# Patient Record
Sex: Female | Born: 1995 | Race: White | Hispanic: No | Marital: Single | Smoking: Never smoker
Health system: Southern US, Community
[De-identification: ages and names within clinical notes are randomized; demographics above are authoritative.]

---

## 2019-05-05 ENCOUNTER — Emergency Department (HOSPITAL_COMMUNITY): Payer: PRIVATE HEALTH INSURANCE

## 2019-05-05 ENCOUNTER — Emergency Department (HOSPITAL_COMMUNITY)
Admission: EM | Admit: 2019-05-05 | Discharge: 2019-05-05 | Disposition: A | Discharge status: Home / Self Care | Payer: PRIVATE HEALTH INSURANCE | Attending: Emergency Medicine | Admitting: Emergency Medicine

## 2019-05-05 ENCOUNTER — Encounter (HOSPITAL_COMMUNITY): Payer: Self-pay | Admitting: Emergency Medicine

## 2019-05-05 ENCOUNTER — Other Ambulatory Visit: Payer: Self-pay

## 2019-05-05 DIAGNOSIS — R1032 Left lower quadrant pain: Secondary | ICD-10-CM | POA: Diagnosis present

## 2019-05-05 DIAGNOSIS — N200 Calculus of kidney: Secondary | ICD-10-CM | POA: Diagnosis not present

## 2019-05-05 LAB — URINALYSIS, ROUTINE W REFLEX MICROSCOPIC
Bacteria, UA: NONE SEEN
Bilirubin Urine: NEGATIVE
Glucose, UA: NEGATIVE mg/dL
Ketones, ur: NEGATIVE mg/dL
Leukocytes,Ua: NEGATIVE
Nitrite: NEGATIVE
Protein, ur: NEGATIVE mg/dL
RBC / HPF: >50 RBC/hpf — ABNORMAL HIGH (ref 0–5)
Specific Gravity, Urine: >1.046 — ABNORMAL HIGH (ref 1.005–1.030)
pH: 6 (ref 5.0–8.0)

## 2019-05-05 LAB — WET PREP, GENITAL
Clue Cells Wet Prep HPF POC: NONE SEEN
Sperm: NONE SEEN
Trich, Wet Prep: NONE SEEN
WBC, Wet Prep HPF POC: NONE SEEN
Yeast Wet Prep HPF POC: NONE SEEN

## 2019-05-05 LAB — COMPREHENSIVE METABOLIC PANEL
ALT: 16 U/L (ref 0–44)
AST: 19 U/L (ref 15–41)
Albumin: 4.1 g/dL (ref 3.5–5.0)
Alkaline Phosphatase: 73 U/L (ref 38–126)
Anion gap: 11 (ref 5–15)
BUN: 11 mg/dL (ref 6–20)
CO2: 23 mmol/L (ref 22–32)
Calcium: 9.2 mg/dL (ref 8.9–10.3)
Chloride: 108 mmol/L (ref 98–111)
Creatinine, Ser: 0.78 mg/dL (ref 0.44–1.00)
GFR calc Af Amer: >60 mL/min (ref >60)
GFR calc non Af Amer: >60 mL/min (ref >60)
Glucose, Bld: 132 mg/dL — ABNORMAL HIGH (ref 70–99)
Potassium: 3.8 mmol/L (ref 3.5–5.1)
Sodium: 142 mmol/L (ref 135–145)
Total Bilirubin: 1.3 mg/dL — ABNORMAL HIGH (ref 0.3–1.2)
Total Protein: 7.1 g/dL (ref 6.5–8.1)

## 2019-05-05 LAB — CBC
HCT: 40.4 % (ref 36.0–46.0)
Hemoglobin: 13.9 g/dL (ref 12.0–15.0)
MCH: 41 pg — ABNORMAL HIGH (ref 26.0–34.0)
MCHC: 34.4 g/dL (ref 30.0–36.0)
MCV: 119.2 fL — ABNORMAL HIGH (ref 80.0–100.0)
Platelets: 340 10*3/uL (ref 150–400)
RBC: 3.39 MIL/uL — ABNORMAL LOW (ref 3.87–5.11)
RDW: 16.5 % — ABNORMAL HIGH (ref 11.5–15.5)
WBC: 13.3 10*3/uL — ABNORMAL HIGH (ref 4.0–10.5)
nRBC: 0 % (ref 0.0–0.2)

## 2019-05-05 LAB — I-STAT BETA HCG BLOOD, ED (MC, WL, AP ONLY): I-stat hCG, quantitative: <5 m[IU]/mL (ref <5)

## 2019-05-05 LAB — LIPASE, BLOOD: Lipase: 23 U/L (ref 11–51)

## 2019-05-05 LAB — HIV ANTIBODY (ROUTINE TESTING W REFLEX): HIV Screen 4th Generation wRfx: NONREACTIVE

## 2019-05-05 MED ORDER — IOHEXOL 300 MG/ML  SOLN
100.0000 mL | Freq: Once | INTRAMUSCULAR | Status: AC | PRN
  Administered 2019-05-05: 100 mL via INTRAVENOUS

## 2019-05-05 MED ORDER — ONDANSETRON HCL 4 MG/2ML IJ SOLN
4.0000 mg | Freq: Once | INTRAMUSCULAR | Status: AC
  Administered 2019-05-05: 4 mg via INTRAVENOUS
  Filled 2019-05-05: qty 2

## 2019-05-05 MED ORDER — SODIUM CHLORIDE 0.9% FLUSH: 3.0000 mL | Freq: Once | INTRAVENOUS | Status: DC

## 2019-05-05 MED ORDER — TAMSULOSIN HCL 0.4 MG PO CAPS: 0.4000 mg | ORAL_CAPSULE | Freq: Every day | ORAL | 0 refills | Status: AC

## 2019-05-05 MED ORDER — ONDANSETRON HCL 4 MG PO TABS: 4.0000 mg | ORAL_TABLET | Freq: Three times a day (TID) | ORAL | 0 refills | Status: AC | PRN

## 2019-05-05 MED ORDER — IBUPROFEN 600 MG PO TABS: 600.0000 mg | ORAL_TABLET | Freq: Four times a day (QID) | ORAL | 0 refills | Status: AC | PRN

## 2019-05-05 MED ORDER — SODIUM CHLORIDE (PF) 0.9 % IJ SOLN
INTRAMUSCULAR | Status: AC
  Administered 2019-05-05: 3 mL
  Filled 2019-05-05: qty 50

## 2019-05-05 MED ORDER — KETOROLAC TROMETHAMINE 30 MG/ML IJ SOLN
30.0000 mg | Freq: Once | INTRAMUSCULAR | Status: AC
  Administered 2019-05-05: 30 mg via INTRAVENOUS
  Filled 2019-05-05: qty 1

## 2019-05-05 MED ORDER — CEPHALEXIN 500 MG PO CAPS: ORAL_CAPSULE | ORAL | 0 refills | Status: AC

## 2019-05-05 MED ORDER — HYDROMORPHONE HCL 1 MG/ML IJ SOLN
1.0000 mg | Freq: Once | INTRAMUSCULAR | Status: AC
  Administered 2019-05-05: 1 mg via INTRAVENOUS
  Filled 2019-05-05: qty 1

## 2019-05-05 MED ORDER — MORPHINE SULFATE (PF) 4 MG/ML IV SOLN
4.0000 mg | Freq: Once | INTRAVENOUS | Status: AC
  Administered 2019-05-05: 4 mg via INTRAVENOUS
  Filled 2019-05-05: qty 1

## 2019-05-05 NOTE — ED Notes (Addendum)
Discharge paperwork and prescriptions reviewed with pt.  Pt verbalized understanding,  Ambulatory at discharge.

## 2019-05-05 NOTE — ED Provider Notes (Signed)
[facility] Provider Note   CSN: [id number] Arrival date & time: 05/05/19  2766   24 year old female presenting for evaluation of abdominal pain.  Patient reports she does have history of recurrent abdominal pain however this morning while in class she developed acute onset of pain to her left lower abdomen radiates to her back.  Pain is intense, sharp, with associated nauseous, and has vomited multiple episodes of nonbloody nonbilious contents.  Rates pain as 8 out of 10, nothing seems to make it better or worse.  No associated fever but she felt hot.  No chest pain shortness of breath.  She did not notice any blood in the urine.  Her last menstrual period was several weeks ago.  Her last sexual activities was 3 days ago.  She denies any specific treatment tried.  History Chief Complaint  Patient presents with  . Abdominal Pain  . Nausea  . Emesis  . Flank Pain    Maria[facility] Robinson is a 24 y.o. female.  The history is provided by the patient. No language interpreter was used.  Abdominal Pain Associated symptoms: vomiting   Emesis Associated symptoms: abdominal pain   Flank Pain Associated symptoms include abdominal pain.       History reviewed. No pertinent past medical history.  There are no problems to display for this patient.   History reviewed. No pertinent surgical history.   OB History   No obstetric history on file.     No family history on file.  Social History   Tobacco Use  . Smoking status: Never Smoker  . Smokeless tobacco: Never Used  Substance Use Topics  . Alcohol use: Never  . Drug use: Never    Home Medications Prior to Admission medications   Not on File    Allergies    Patient has no allergy information on record.  Review of Systems   Review of Systems  Gastrointestinal: Positive for abdominal pain and vomiting.  Genitourinary: Positive for flank pain.  All other systems reviewed and are  negative.   Physical Exam Updated Vital Signs BP 135/79 (BP Location: Right Arm)   Pulse 89   Temp 98.9 F (37.2 C) (Oral)   Resp 19   SpO2 100%   Physical Exam Vitals and nursing note reviewed.  Constitutional:      Appearance: She is well-developed.     Comments: Patient is bending over, appears uncomfortable, dry heaving and complain of abdominal pain.  HENT:     Head: Atraumatic.  Eyes:     Conjunctiva/sclera: Conjunctivae normal.  Cardiovascular:     Rate and Rhythm: Normal rate and regular rhythm.  Pulmonary:     Effort: Pulmonary effort is normal.     Breath sounds: Normal breath sounds.  Chest:     Chest wall: No tenderness.  Abdominal:     General: Abdomen is flat.     Tenderness: There is abdominal tenderness in the suprapubic area and left lower quadrant. There is guarding. There is no right CVA tenderness, left CVA tenderness or rebound. Negative signs include Murphy's sign, Rovsing's sign and McBurney's sign.  Genitourinary:    Comments: Chaperone present during exam.  No inguinal lymphadenopathy or inguinal hernia noted.  Normal external genitalia.  Cervical os is closed and normal appearance.  Minimal vaginal discharge noted.  On bimanual examination left adnexal tenderness without cervical motion tenderness. Musculoskeletal:     Cervical back: Neck supple.  Skin:    Findings: No rash.  Neurological:     Mental Status: She is alert.     ED Results / Procedures / Treatments   Labs (all labs ordered are listed, but only abnormal results are displayed) Labs Reviewed  COMPREHENSIVE METABOLIC PANEL - Abnormal; Notable for the following components:      Result Value   Glucose, Bld 132 (*)    Total Bilirubin 1.3 (*)    All other components within normal limits  CBC - Abnormal; Notable for the following components:   WBC 13.3 (*)    RBC 3.39 (*)    MCV 119.2 (*)    MCH 41.0 (*)    RDW 16.5 (*)    All other components within normal limits  URINALYSIS,  ROUTINE W REFLEX MICROSCOPIC - Abnormal; Notable for the following components:   Specific Gravity, Urine >1.046 (*)    Hgb urine dipstick LARGE (*)    RBC / HPF >50 (*)    All other components within normal limits  WET PREP, GENITAL  LIPASE, BLOOD  HIV ANTIBODY (ROUTINE TESTING W REFLEX)  RPR  I-STAT BETA HCG BLOOD, ED ([facility], [facility], [facility] ONLY)  GC/CHLAMYDIA PROBE AMP ([place]) NOT AT [facility]    EKG None  Radiology CT ABDOMEN PELVIS W CONTRAST  Result Date: 05/05/2019 CLINICAL DATA:  Abdominal pain EXAM: CT ABDOMEN AND PELVIS WITH CONTRAST TECHNIQUE: Multidetector CT imaging of the abdomen and pelvis was performed using the standard protocol following bolus administration of intravenous contrast. CONTRAST:  143mL OMNIPAQUE IOHEXOL 300 MG/ML  SOLN COMPARISON:  None. FINDINGS: Lower chest: There is slight bibasilar atelectasis. Lung bases otherwise are clear. Hepatobiliary: There is a degree of hepatic steatosis. There is a focal area of enhancement in the anterior segment of the left lobe of the liver peripherally measuring 0.9 x 0.6 cm, a questionable small hemangioma. No other focal liver lesions are evident. Gallbladder wall is not appreciably thickened. There is no evident biliary duct dilatation. Pancreas: There is no pancreatic mass or inflammatory focus. Spleen: No splenic lesions are evident. Adrenals/Urinary Tract: Adrenals bilaterally appear normal. There is no renal mass on either side. There is moderate hydronephrosis on the left. There is no intrarenal calculus on either side. There is a 2 mm calculus at the left ureterovesical junction. No other ureteral calculi evident. Urinary bladder is midline with wall thickness within normal limits. Stomach/Bowel: There is no appreciable bowel wall or mesenteric thickening. There is no evident bowel obstruction. Terminal ileum appears unremarkable. There is no evident free air or portal venous air. Vascular/Lymphatic: No abdominal aortic aneurysm. No  arterial vascular lesions are apparent. Major venous structures appear patent. There is no evident adenopathy in the abdomen or pelvis. Reproductive: The uterus is retroverted. There is a cyst in the left ovary measuring 2.8 x 2.3 cm, a likely dominant follicle. No other extrauterine pelvic/adnexal mass. Other: Appendix appears unremarkable. No abscess or ascites evident in the abdomen or pelvis. Musculoskeletal: There are no blastic or lytic bone lesions. No intramuscular or abdominal wall lesions evident. IMPRESSION: 1. 2 mm calculus at the left ureterovesical junction with moderate hydronephrosis on the left. 2. Hepatic steatosis. Questionable small hemangioma in the right lobe of the liver anteriorly. 3. No bowel obstruction. No abscess in the abdomen or pelvis. Appendix appears normal. 4.  Probable dominant follicle left ovary. Electronically Signed   By: Lowella Grip III M.D.   On: 05/05/2019 12:38    Procedures Procedures (including critical care time)  Medications Ordered in ED Medications  sodium chloride flush (NS) 0.9 % injection 3 mL (has no administration in time range)    ED Course  I have reviewed the triage vital signs and the nursing notes.  Pertinent labs & imaging results that were available during my care of the patient were reviewed by me and considered in my medical decision making (see chart for details).    MDM Rules/Calculators/A&P                      BP (!) 130/91   Pulse 72   Temp 98.9 F (37.2 C) (Oral)   Resp 19   SpO2 100%   Final Clinical Impression(s) / ED Diagnoses Final diagnoses:  Kidney stone on left side    Rx / DC Orders ED Discharge Orders         Ordered    tamsulosin (FLOMAX) 0.4 MG CAPS capsule  Daily     05/05/19 1440    ibuprofen (ADVIL) 600 MG tablet  Every 6 hours PRN     05/05/19 1440    ondansetron (ZOFRAN) 4 MG tablet  Every 8 hours PRN     05/05/19 1440    cephALEXin (KEFLEX) 500 MG capsule     05/05/19 1441          10:48 AM Patient here with no abdominal pain, appears to be very uncomfortable.  She does have tenderness to her suprapubic and left lower quadrant on exam but no CVA tenderness.  Will provide symptomatic treatment, will perform pelvic examination and will determine further management.  12:04 PM Patient does have left adnexal tenderness without cervical motion tenderness.  Pelvic examination is otherwise unremarkable.  Given her back pain and left lower quadrant abdominal pain, will obtain abdominal pelvic CT scan for further evaluation.  If negative, may consider pelvic ultrasound to rule out ovarian torsion however my suspicion is low.  Additional pain medication provided.  2:35 PM UA shows large hemoglobin and urine dipsticks as well as 11-20 WBC.  Leukocyte Estrace negative and nitrite is negative.  Wet prep is unremarkable.  Now leukocytosis with WBC 13.3.  Electrolyte panels are reassuring, normal kidney function.  An abdominal and pelvis CT scan obtained showing a 2 mm kidney stone at the left UVJ with moderate hydronephrosis.  After receiving several dose of pain medication at this time patient is resting more comfortable.  I discussed finding with patient and felt that kidney stone is likely the cause of her pain.  Will discharge home with symptomatic treatment, urology referral, and as a precaution we will also prescribe antibiotic and patient recommend to take it if she developed urinary symptoms.  Return precaution discussed.   Fayrene Helper, PA-C 05/05/19 1444    Linwood Dibbles, MD 05/06/19 [phone]

## 2019-05-05 NOTE — ED Notes (Signed)
Pt attempting to void.  

## 2019-05-05 NOTE — Discharge Instructions (Signed)
You have been diagnosed with a 2 mm kidney stone on the left side.  This is the source of your pain.  You would likely pass the stone in the next 1 to 2 days.  There is early signs of a potential urinary tract infection on your urine today.  If you develop burning sensation while urinating or urinary frequency or urgency, take antibiotic as prescribed for the full duration.

## 2019-05-05 NOTE — ED Notes (Signed)
Pt ambulatory to bathroom, reports being unable to void at this time.  Pt aware urine sample is needed.

## 2019-05-05 NOTE — ED Triage Notes (Signed)
Patient here from home reports left lower quadrant abd pain radiating around to back, n/v that started today.

## 2019-05-06 LAB — GC/CHLAMYDIA PROBE AMP (~~LOC~~) NOT AT ARMC
Chlamydia: NEGATIVE
Neisseria Gonorrhea: NEGATIVE

## 2019-05-06 LAB — RPR: RPR Ser Ql: NONREACTIVE

## 2020-10-19 IMAGING — CT CT ABD-PELV W/ CM
2 of 4 series · 14 of 46 positions shown, 16 images · IV contrast (omnipaque)
Comparison: None.

CLINICAL DATA: Abdominal pain

EXAM:
CT ABDOMEN AND PELVIS WITH CONTRAST
TECHNIQUE: Multidetector CT imaging of the abdomen and pelvis was performed
using the standard protocol following bolus administration of
intravenous contrast.
CONTRAST:  100mL OMNIPAQUE IOHEXOL 300 MG/ML  SOLN

[Series 2: axial st · axial · 0.60mm/px · z∈[-413,-28]mm · 11 of 87 slices shown, 13 images]
[im 5/87  soft-tissue]
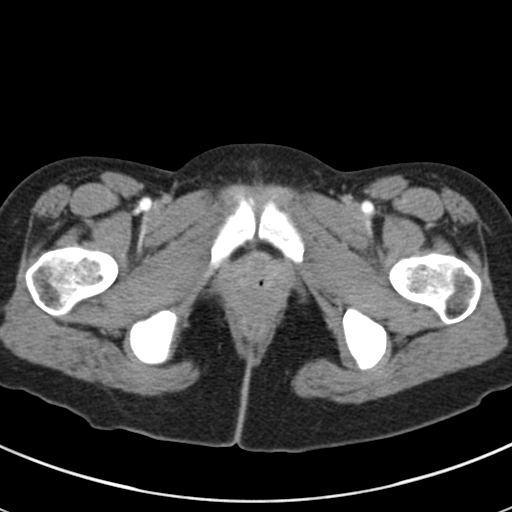
[im 5/87  bone]
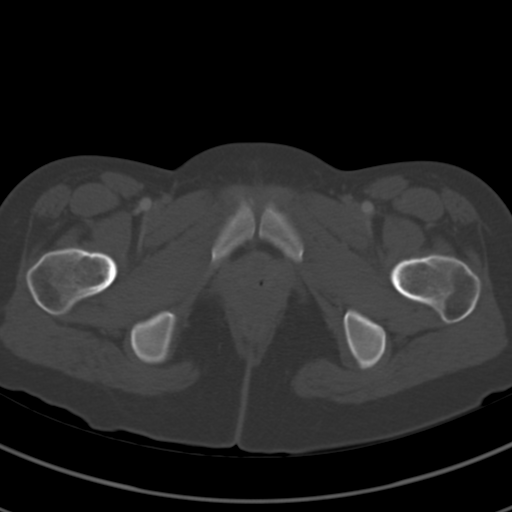
[im 13/87  soft-tissue]
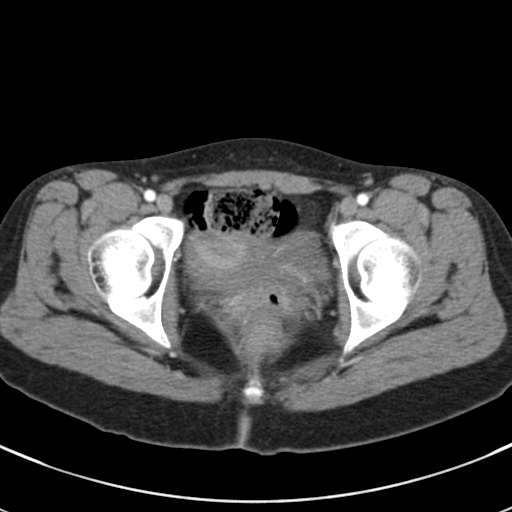
[im 22/87  soft-tissue]
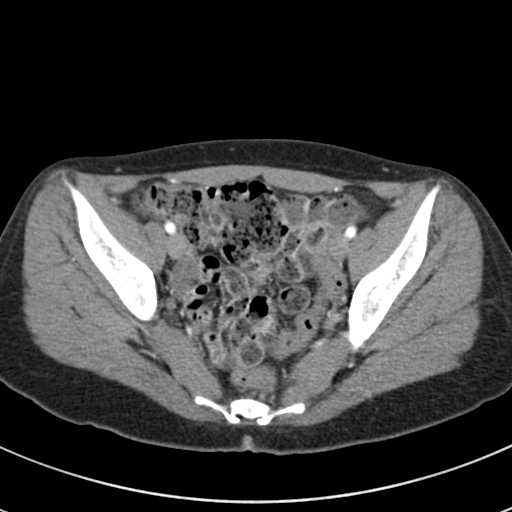
[im 31/87  soft-tissue]
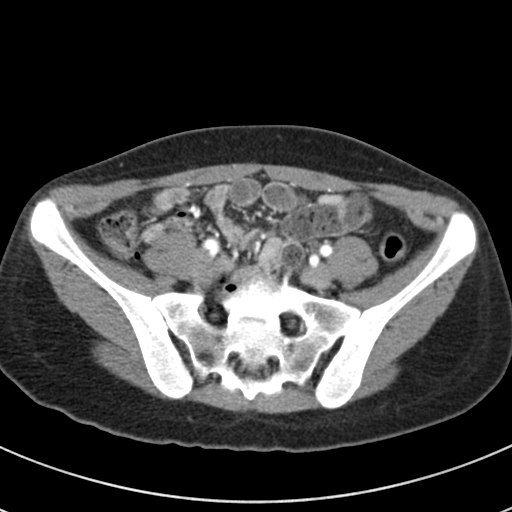
[im 35/87  soft-tissue]
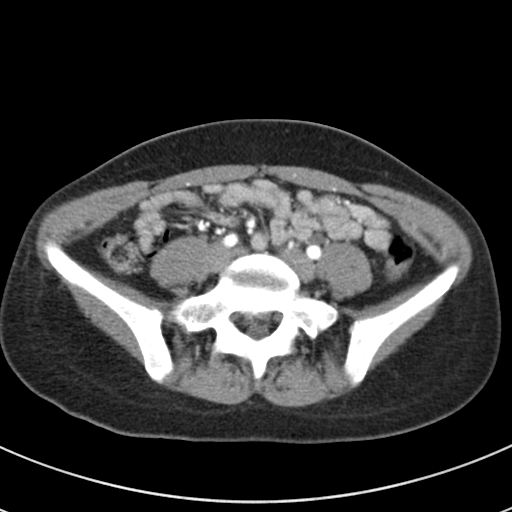
[im 44/87  soft-tissue]
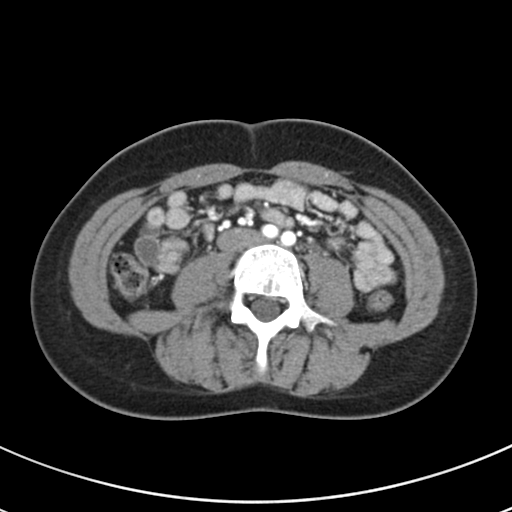
[im 52/87  soft-tissue]
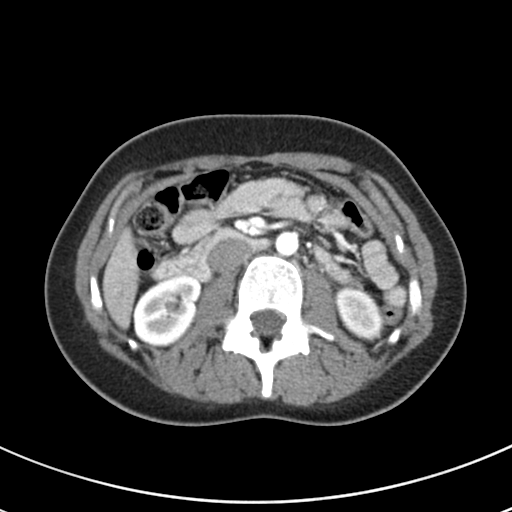
[im 56/87  soft-tissue]
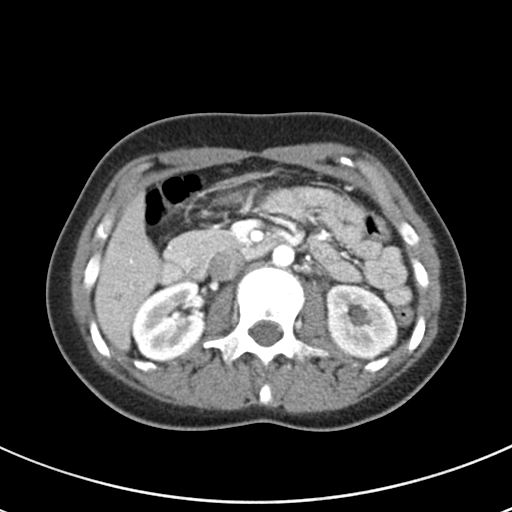
[im 65/87  soft-tissue]
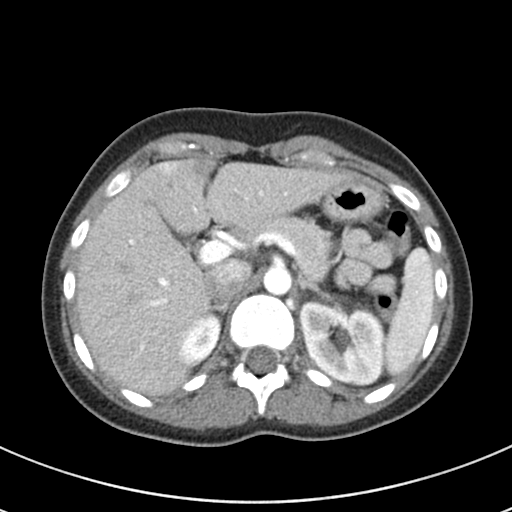
[im 65/87  bone]
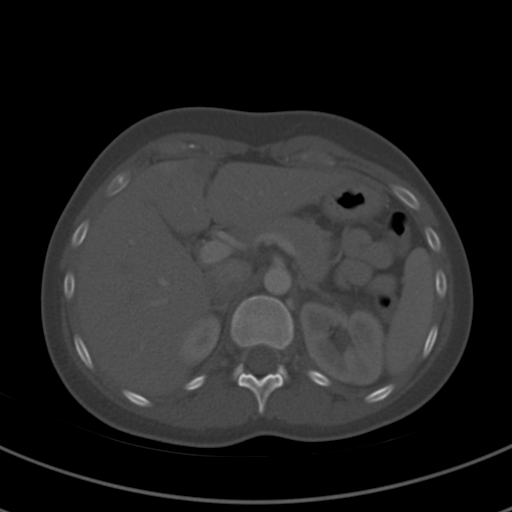
[im 74/87  soft-tissue]
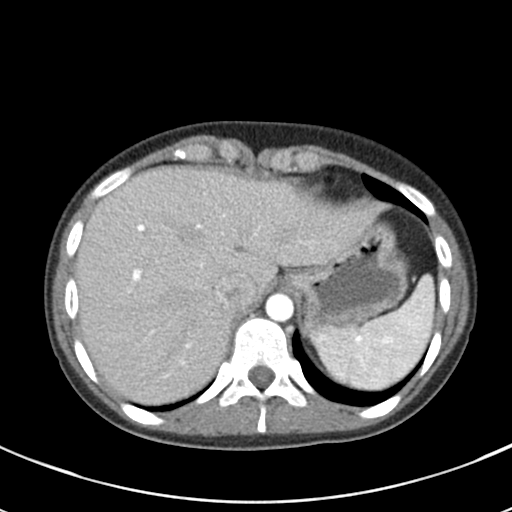
[im 82/87  soft-tissue]
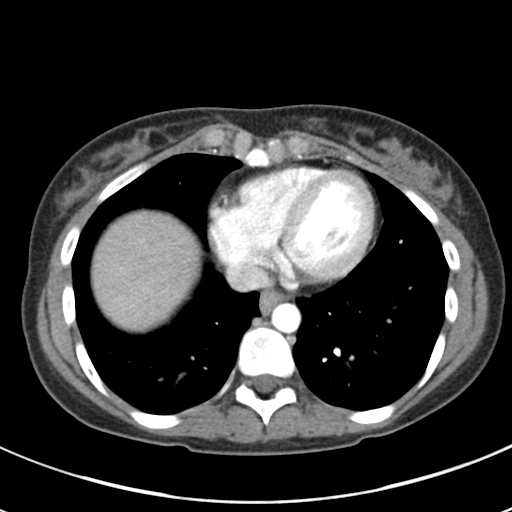

[Series 5: coronal st · coronal · 0.56mm/px · 3 of 95 slices shown]
[im 32/95  soft-tissue]
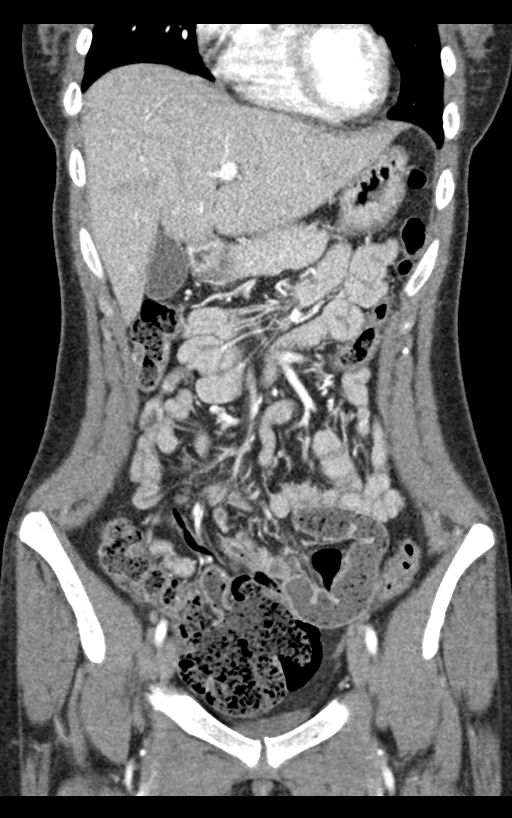
[im 42/95  soft-tissue]
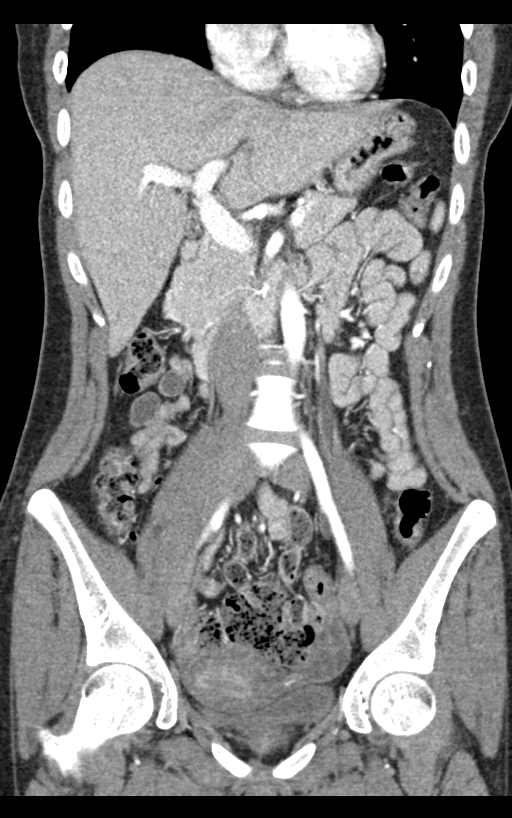
[im 53/95  soft-tissue]
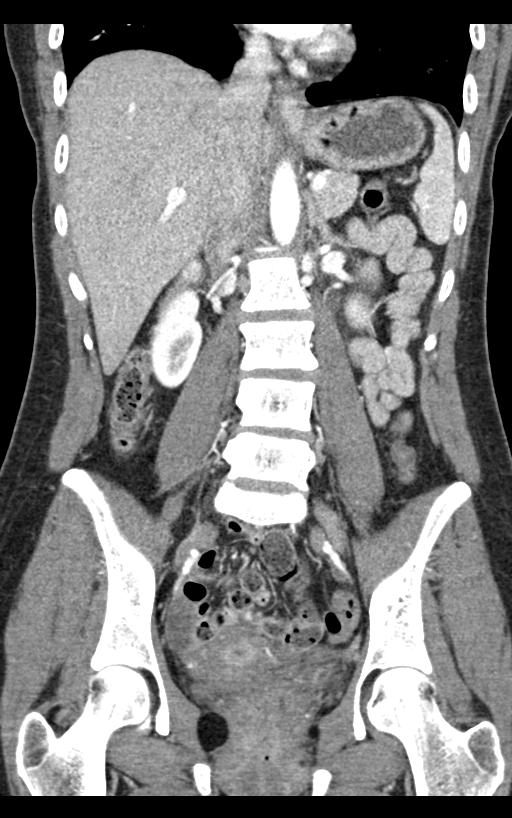

[14 of 46 positions shown; findings below may reference images not displayed]

FINDINGS: Lower chest: There is slight bibasilar atelectasis. Lung bases
otherwise are clear.

Hepatobiliary: There is a degree of hepatic steatosis. There is a
focal area of enhancement in the anterior segment of the left lobe
of the liver peripherally measuring 0.9 x 0.6 cm, a questionable
small hemangioma. No other focal liver lesions are evident.
Gallbladder wall is not appreciably thickened. There is no evident
biliary duct dilatation.

Pancreas: There is no pancreatic mass or inflammatory focus.

Spleen: No splenic lesions are evident.

Adrenals/Urinary Tract: Adrenals bilaterally appear normal. There is
no renal mass on either side. There is moderate hydronephrosis on
the left. There is no intrarenal calculus on either side. There is a
2 mm calculus at the left ureterovesical junction. No other ureteral
calculi evident. Urinary bladder is midline with wall thickness
within normal limits.

Stomach/Bowel: There is no appreciable bowel wall or mesenteric
thickening. There is no evident bowel obstruction. Terminal ileum
appears unremarkable. There is no evident free air or portal venous
air.

Vascular/Lymphatic: No abdominal aortic aneurysm. No arterial
vascular lesions are apparent. Major venous structures appear
patent. There is no evident adenopathy in the abdomen or pelvis.

Reproductive: The uterus is retroverted. There is a cyst in the left
ovary measuring 2.8 x 2.3 cm, a likely dominant follicle. No other
extrauterine pelvic/adnexal mass.

Other: Appendix appears unremarkable. No abscess or ascites evident
in the abdomen or pelvis.

Musculoskeletal: There are no blastic or lytic bone lesions. No
intramuscular or abdominal wall lesions evident.
IMPRESSION: 1. 2 mm calculus at the left ureterovesical junction with moderate
hydronephrosis on the left.

2. Hepatic steatosis. Questionable small hemangioma in the right
lobe of the liver anteriorly.

3. No bowel obstruction. No abscess in the abdomen or pelvis.
Appendix appears normal.

4.  Probable dominant follicle left ovary.
